# Patient Record
Sex: Male | Born: 1990 | Race: Black or African American | Hispanic: No | Marital: Single | Smoking: Former smoker
Health system: Southern US, Community
[De-identification: ages and names within clinical notes are randomized; demographics above are authoritative.]

---

## 2008-05-19 ENCOUNTER — Emergency Department (HOSPITAL_COMMUNITY): Admission: EM | Admit: 2008-05-19 | Discharge: 2008-05-19 | Payer: Self-pay | Admitting: Emergency Medicine

## 2009-10-08 ENCOUNTER — Emergency Department (HOSPITAL_COMMUNITY): Admission: EM | Admit: 2009-10-08 | Discharge: 2009-10-08 | Payer: Self-pay | Admitting: Family Medicine

## 2010-08-29 LAB — RPR: RPR Ser Ql: NONREACTIVE

## 2010-08-29 LAB — HIV ANTIBODY (ROUTINE TESTING W REFLEX): HIV: NONREACTIVE

## 2010-08-29 LAB — GC/CHLAMYDIA PROBE AMP, GENITAL
Chlamydia, DNA Probe: NEGATIVE
GC Probe Amp, Genital: POSITIVE — AB

## 2013-04-06 ENCOUNTER — Encounter (HOSPITAL_COMMUNITY): Payer: Self-pay | Admitting: Emergency Medicine

## 2013-04-06 ENCOUNTER — Emergency Department (INDEPENDENT_AMBULATORY_CARE_PROVIDER_SITE_OTHER)
Admission: EM | Admit: 2013-04-06 | Discharge: 2013-04-06 | Disposition: A | Discharge status: Home / Self Care | Payer: Self-pay | Source: Home / Self Care | Attending: Family Medicine | Admitting: Family Medicine

## 2013-04-06 DIAGNOSIS — M549 Dorsalgia, unspecified: Secondary | ICD-10-CM
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
## 2013-04-10 NOTE — ED Provider Notes (Signed)
Medical screening examination/treatment/procedure(s) were performed by resident physician or non-physician practitioner and as supervising physician I was immediately available for consultation/collaboration.   Deneise Getty DOUGLAS MD.   Lilo Wallington D Khaniya Tenaglia, MD 04/10/13 1843 

## 2015-10-06 ENCOUNTER — Encounter (HOSPITAL_COMMUNITY): Payer: Self-pay | Admitting: Emergency Medicine

## 2015-10-06 ENCOUNTER — Emergency Department (HOSPITAL_COMMUNITY): Payer: No Typology Code available for payment source

## 2015-10-06 ENCOUNTER — Emergency Department (HOSPITAL_COMMUNITY)
Admission: EM | Admit: 2015-10-06 | Discharge: 2015-10-06 | Disposition: A | Discharge status: Home / Self Care | Payer: No Typology Code available for payment source | Attending: Emergency Medicine | Admitting: Emergency Medicine

## 2015-10-06 DIAGNOSIS — M79604 Pain in right leg: Secondary | ICD-10-CM | POA: Insufficient documentation

## 2015-10-06 DIAGNOSIS — R0781 Pleurodynia: Secondary | ICD-10-CM | POA: Diagnosis not present

## 2015-10-06 DIAGNOSIS — F17200 Nicotine dependence, unspecified, uncomplicated: Secondary | ICD-10-CM | POA: Insufficient documentation

## 2015-10-06 DIAGNOSIS — R109 Unspecified abdominal pain: Secondary | ICD-10-CM | POA: Diagnosis present
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
## 2015-12-21 NOTE — ED Provider Notes (Signed)
Medical screening examination/treatment/procedure(s) were performed by non-physician practitioner and as supervising physician I was immediately available for consultation/collaboration.   EKG Interpretation None       Jacalyn LefevreJulie Chaunta Bejarano, MD 12/21/15 1357

## 2016-11-01 ENCOUNTER — Ambulatory Visit (HOSPITAL_COMMUNITY)
Admission: EM | Admit: 2016-11-01 | Discharge: 2016-11-01 | Disposition: A | Discharge status: Home / Self Care | Payer: PRIVATE HEALTH INSURANCE | Attending: Family Medicine | Admitting: Family Medicine

## 2016-11-01 ENCOUNTER — Encounter (HOSPITAL_COMMUNITY): Payer: Self-pay | Admitting: Emergency Medicine

## 2016-11-01 DIAGNOSIS — M7918 Myalgia, other site: Secondary | ICD-10-CM

## 2016-11-01 DIAGNOSIS — M791 Myalgia: Secondary | ICD-10-CM | POA: Diagnosis not present

## 2016-11-01 DIAGNOSIS — M5489 Other dorsalgia: Secondary | ICD-10-CM | POA: Diagnosis not present

## 2016-11-01 MED ORDER — CYCLOBENZAPRINE HCL 10 MG PO TABS: 10.0000 mg | ORAL_TABLET | Freq: Two times a day (BID) | ORAL | 0 refills | Status: AC | PRN

## 2016-11-01 MED ORDER — DICLOFENAC SODIUM 75 MG PO TBEC: 75.0000 mg | DELAYED_RELEASE_TABLET | Freq: Two times a day (BID) | ORAL | 0 refills | Status: AC
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
## 2017-04-29 ENCOUNTER — Encounter (HOSPITAL_COMMUNITY): Payer: Self-pay | Admitting: *Deleted

## 2017-04-29 ENCOUNTER — Emergency Department (HOSPITAL_COMMUNITY)
Admission: EM | Admit: 2017-04-29 | Discharge: 2017-04-29 | Disposition: A | Discharge status: Home / Self Care | Payer: PRIVATE HEALTH INSURANCE | Attending: Emergency Medicine | Admitting: Emergency Medicine

## 2017-04-29 DIAGNOSIS — Z5321 Procedure and treatment not carried out due to patient leaving prior to being seen by health care provider: Secondary | ICD-10-CM | POA: Insufficient documentation

## 2017-04-29 DIAGNOSIS — R9431 Abnormal electrocardiogram [ECG] [EKG]: Secondary | ICD-10-CM | POA: Insufficient documentation

## 2017-04-29 NOTE — ED Notes (Signed)
Pt to NF requesting update, apologized for delay

## 2017-04-29 NOTE — ED Notes (Signed)
Pt states he doesn't want to wait any longer, he will contact medical records in AM for copy of EKG

## 2017-04-29 NOTE — ED Triage Notes (Signed)
Pt reports having a physical done at work and sent here due to having abnormal EKG. Denies any cp or sob. No distress is noted at triage.

## 2017-04-30 ENCOUNTER — Ambulatory Visit: Payer: PRIVATE HEALTH INSURANCE | Admitting: Cardiology
